# Patient Record
Sex: Female | Born: 1995 | Hispanic: No | Marital: Single | Smoking: Never smoker
Health system: Southern US, Community
[De-identification: ages and names within clinical notes are randomized; demographics above are authoritative.]

## PROBLEM LIST (undated history)

## (undated) DIAGNOSIS — N926 Irregular menstruation, unspecified: Principal | ICD-10-CM

## (undated) HISTORY — DX: Irregular menstruation, unspecified: N92.6

---
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
## 2013-05-22 ENCOUNTER — Encounter: Payer: Self-pay | Admitting: *Deleted

## 2013-05-23 ENCOUNTER — Ambulatory Visit: Payer: Self-pay

## 2013-05-25 ENCOUNTER — Ambulatory Visit (INDEPENDENT_AMBULATORY_CARE_PROVIDER_SITE_OTHER): Payer: Medicaid Other | Admitting: Obstetrics & Gynecology

## 2013-05-25 ENCOUNTER — Encounter: Payer: Self-pay | Admitting: Obstetrics & Gynecology

## 2013-05-25 VITALS — BP 100/60 | Ht 62.5 in | Wt 146.0 lb

## 2013-05-25 DIAGNOSIS — Z3202 Encounter for pregnancy test, result negative: Secondary | ICD-10-CM

## 2013-05-25 DIAGNOSIS — Z3049 Encounter for surveillance of other contraceptives: Secondary | ICD-10-CM

## 2013-05-25 DIAGNOSIS — Z3200 Encounter for pregnancy test, result unknown: Secondary | ICD-10-CM

## 2013-05-25 DIAGNOSIS — Z309 Encounter for contraceptive management, unspecified: Secondary | ICD-10-CM

## 2013-05-25 LAB — POCT URINE PREGNANCY: Preg Test, Ur: NEGATIVE

## 2013-05-25 MED ORDER — MEDROXYPROGESTERONE ACETATE 150 MG/ML IM SUSP
150.0000 mg | Freq: Once | INTRAMUSCULAR | Status: AC
  Administered 2013-05-25: 150 mg via INTRAMUSCULAR

## 2013-08-17 ENCOUNTER — Ambulatory Visit: Payer: Medicaid Other

## 2013-11-11 ENCOUNTER — Emergency Department (HOSPITAL_COMMUNITY)
Admission: EM | Admit: 2013-11-11 | Discharge: 2013-11-11 | Disposition: A | Discharge status: Home / Self Care | Payer: Self-pay | Attending: Emergency Medicine | Admitting: Emergency Medicine

## 2013-11-11 ENCOUNTER — Encounter (HOSPITAL_COMMUNITY): Payer: Self-pay | Admitting: Emergency Medicine

## 2013-11-11 DIAGNOSIS — R220 Localized swelling, mass and lump, head: Secondary | ICD-10-CM

## 2013-11-11 DIAGNOSIS — L239 Allergic contact dermatitis, unspecified cause: Secondary | ICD-10-CM

## 2013-11-11 DIAGNOSIS — L253 Unspecified contact dermatitis due to other chemical products: Secondary | ICD-10-CM | POA: Insufficient documentation

## 2013-11-11 DIAGNOSIS — Z79899 Other long term (current) drug therapy: Secondary | ICD-10-CM | POA: Insufficient documentation

## 2013-11-11 MED ORDER — PREDNISONE (PAK) 10 MG PO TABS: ORAL_TABLET | Freq: Every day | ORAL | Status: DC

## 2013-11-11 MED ORDER — LORATADINE 10 MG PO TABS
10.0000 mg | ORAL_TABLET | Freq: Once | ORAL | Status: AC
  Administered 2013-11-11: 10 mg via ORAL
  Filled 2013-11-11: qty 1

## 2013-11-11 MED ORDER — FAMOTIDINE 20 MG PO TABS
20.0000 mg | ORAL_TABLET | Freq: Once | ORAL | Status: AC
  Administered 2013-11-11: 20 mg via ORAL
  Filled 2013-11-11: qty 1

## 2013-11-11 MED ORDER — LORATADINE 10 MG PO TABS: 10.0000 mg | ORAL_TABLET | Freq: Every day | ORAL | Status: DC

## 2013-11-11 MED ORDER — PREDNISONE 50 MG PO TABS
60.0000 mg | ORAL_TABLET | Freq: Once | ORAL | Status: AC
  Administered 2013-11-11: 60 mg via ORAL
  Filled 2013-11-11 (×2): qty 1

## 2013-11-11 MED ORDER — FAMOTIDINE 20 MG PO TABS: 20.0000 mg | ORAL_TABLET | Freq: Two times a day (BID) | ORAL | Status: DC

## 2013-11-11 NOTE — ED Notes (Signed)
Pt states she used a new lipstick last night. Sates she woke up this morning with her face ans mouth swollen.

## 2013-11-11 NOTE — ED Provider Notes (Signed)
CSN: [id number]     Arrival date & time 11/11/13  3086 History   First MD Initiated Contact with Patient 11/11/13 0930     Chief Complaint  Patient presents with  . Allergic Reaction   (Consider location/radiation/quality/duration/timing/severity/associated sxs/prior Treatment) Patient is a 17 y.o. female presenting with allergic reaction. The history is provided by the patient and a parent.  Allergic Reaction Presenting symptoms: swelling   Presenting symptoms: no difficulty breathing, no difficulty swallowing, no itching, no rash and no wheezing   Severity:  Moderate Prior episodes: lip stick. Context: cosmetics   Relieved by:  Nothing Worsened by:  Nothing tried Ineffective treatments:  Antihistamines  Frances Pierce is a 17 y.o. female who presents to the ED with swelling to her lips and mild facial swelling. She used a lip stick last night about 7:30 and soon after noted the swelling. Her mother has been giving her Benadryl. She slept last night but this morning continues to have swelling despite the Benadryl. She had a similar episode last week when she used another lip product but the swelling went away with Benadryl. She denies shortness of breath, difficulty swallowing or other problems.   History reviewed. No pertinent past medical history. History reviewed. No pertinent past surgical history. No family history on file. History  Substance Use Topics  . Smoking status: Never Smoker   . Smokeless tobacco: Never Used  . Alcohol Use: No   OB History   Grav Para Term Preterm Abortions TAB SAB Ect Mult Living                 Review of Systems  Constitutional: Negative for fever, chills and activity change.  HENT: Negative for congestion, drooling, sore throat and trouble swallowing. Facial swelling: minimal.        Swollen lips   Eyes: Negative for redness and itching.  Respiratory: Negative for chest tightness, shortness of breath and wheezing.   Cardiovascular: Negative  for chest pain.  Gastrointestinal: Negative for nausea, vomiting and abdominal pain.  Musculoskeletal: Negative for joint swelling.  Skin: Negative for itching and rash.  Allergic/Immunologic: Negative for immunocompromised state.  Neurological: Negative for light-headedness and headaches.  Psychiatric/Behavioral: The patient is not nervous/anxious.     Allergies  Review of patient's allergies indicates no known allergies.  Home Medications   Current Outpatient Rx  Name  Route  Sig  Dispense  Refill  . Levonorgestrel-Ethinyl Estradiol (SEASONIQUE) 0.15-0.03 &0.01 MG tablet   Oral   Take 1 tablet by mouth daily.         . medroxyPROGESTERone (DEPO-PROVERA) 150 MG/ML injection   Intramuscular   Inject 150 mg into the muscle every 3 (three) months.          BP 122/74  Pulse 76  Temp(Src) 98.8 F (37.1 C) (Oral)  Resp 20  Ht 5\' 4"  (1.626 m)  Wt 140 lb (63.504 kg)  BMI 24.02 kg/m2  SpO2 100% Physical Exam  Nursing note and vitals reviewed. Constitutional: She is oriented to person, place, and time. She appears well-developed and well-nourished. No distress.  HENT:  Head:    Nose: Nose normal.  Mouth/Throat: Uvula is midline, oropharynx is clear and moist and mucous membranes are normal.  Swelling to upper and lower lip, minimal facial swelling  Eyes: EOM are normal.  Neck: Neck supple.  Cardiovascular: Normal rate and regular rhythm.   Pulmonary/Chest: Effort normal and breath sounds normal. She has no wheezes.  Abdominal: Soft. There is no tenderness.  Musculoskeletal: Normal range of motion.  Neurological: She is alert and oriented to person, place, and time. No cranial nerve deficit.  Skin: Skin is warm and dry.  Psychiatric: She has a normal mood and affect. Her behavior is normal.    ED Course  Procedures Prednisone 60 mg. PO, Claritin 10 mg. PO, Pepcid and ice pack to lips. She has already had Benadryl prior to arrival to the ED. Will observe for  improvement.  MDM  After medications and ice the patient has less swelling to the face and lips. She states she can tell a difference and is feeling better. She is stable for discharge without any immediate complications.  17 y.o. female with allergic reaction to lip stick she used. Will continue treatment of medications started in ED.  Discussed with the patient and all questioned fully answered. She will return if any problems arise.    Medication List    TAKE these medications       famotidine 20 MG tablet  Commonly known as:  PEPCID  Take 1 tablet (20 mg total) by mouth 2 (two) times daily.     loratadine 10 MG tablet  Commonly known as:  CLARITIN  Take 1 tablet (10 mg total) by mouth daily.     predniSONE 10 MG tablet  Commonly known as:  STERAPRED UNI-PAK  Take by mouth daily. Starting tomorrow 11/12/2013 take 5 tablets by mouth, then 4, 3, 2, 1      ASK your doctor about these medications       diphenhydrAMINE 25 mg capsule  Commonly known as:  BENADRYL  Take 25 mg by mouth every 6 (six) hours as needed for allergies.     medroxyPROGESTERone 150 MG/ML injection  Commonly known as:  DEPO-PROVERA  Inject 150 mg into the muscle every 3 (three) months.           Janne Napoleon, NP 11/11/13 1505

## 2013-11-12 NOTE — ED Provider Notes (Signed)
Medical screening examination/treatment/procedure(s) were performed by non-physician practitioner and as supervising physician I was immediately available for consultation/collaboration.  EKG Interpretation   None         Benny Lennert, MD 11/12/13 [phone]

## 2014-05-17 ENCOUNTER — Encounter: Payer: Self-pay | Admitting: Adult Health

## 2014-05-17 ENCOUNTER — Ambulatory Visit (INDEPENDENT_AMBULATORY_CARE_PROVIDER_SITE_OTHER): Payer: BC Managed Care – PPO | Admitting: Adult Health

## 2014-05-17 VITALS — BP 122/70 | Ht 62.5 in | Wt 151.5 lb

## 2014-05-17 DIAGNOSIS — N926 Irregular menstruation, unspecified: Secondary | ICD-10-CM

## 2014-05-17 DIAGNOSIS — Z3202 Encounter for pregnancy test, result negative: Secondary | ICD-10-CM

## 2014-05-17 HISTORY — DX: Irregular menstruation, unspecified: N92.6

## 2014-05-17 LAB — POCT URINE PREGNANCY: PREG TEST UR: NEGATIVE

## 2014-05-17 NOTE — Patient Instructions (Signed)
Use condoms Call if no menses for 3 months

## 2014-05-17 NOTE — Progress Notes (Signed)
Subjective:     Patient ID: Frances Pierce, female   DOB: 10/09/1996, 18 y.o.   MRN: [id number]  HPI Frances Pierce is a 18 year old black female who last had a period first of April.  Review of Systems See HPI Reviewed past medical,surgical, social and family history. Reviewed medications and allergies.     Objective:   Physical Exam BP 122/70  Ht 5' 2.5" (1.588 m)  Wt 151 lb 8 oz (68.72 kg)  BMI 27.25 kg/m2  LMP 03/26/2014   UPT negative, discussed could miss a period occasionally but if having sex needs to use condoms or birth control to prevent pregnancy, and she will use condoms for now  Assessment:     Missed period     Plan:     Use condoms Call if no menses for 3 months

## 2015-10-28 ENCOUNTER — Encounter (HOSPITAL_COMMUNITY): Payer: Self-pay | Admitting: Emergency Medicine

## 2015-10-28 ENCOUNTER — Emergency Department (HOSPITAL_COMMUNITY): Payer: Self-pay

## 2015-10-28 ENCOUNTER — Emergency Department (HOSPITAL_COMMUNITY)
Admission: EM | Admit: 2015-10-28 | Discharge: 2015-10-28 | Disposition: A | Discharge status: Home / Self Care | Payer: Self-pay | Attending: Emergency Medicine | Admitting: Emergency Medicine

## 2015-10-28 DIAGNOSIS — T189XXA Foreign body of alimentary tract, part unspecified, initial encounter: Secondary | ICD-10-CM | POA: Insufficient documentation

## 2015-10-28 DIAGNOSIS — Y9389 Activity, other specified: Secondary | ICD-10-CM | POA: Insufficient documentation

## 2015-10-28 DIAGNOSIS — J029 Acute pharyngitis, unspecified: Secondary | ICD-10-CM | POA: Insufficient documentation

## 2015-10-28 DIAGNOSIS — Z87821 Personal history of retained foreign body fully removed: Secondary | ICD-10-CM

## 2015-10-28 DIAGNOSIS — Z3202 Encounter for pregnancy test, result negative: Secondary | ICD-10-CM | POA: Insufficient documentation

## 2015-10-28 DIAGNOSIS — X58XXXA Exposure to other specified factors, initial encounter: Secondary | ICD-10-CM | POA: Insufficient documentation

## 2015-10-28 DIAGNOSIS — Y998 Other external cause status: Secondary | ICD-10-CM | POA: Insufficient documentation

## 2015-10-28 DIAGNOSIS — Y9289 Other specified places as the place of occurrence of the external cause: Secondary | ICD-10-CM | POA: Insufficient documentation

## 2015-10-28 DIAGNOSIS — Z8742 Personal history of other diseases of the female genital tract: Secondary | ICD-10-CM | POA: Insufficient documentation

## 2015-10-28 LAB — POC URINE PREG, ED: PREG TEST UR: NEGATIVE

## 2015-10-28 NOTE — ED Notes (Signed)
Was drinking a sun drop from a can.  Pt was not eating any food but feels like something is struck in her chest. C/o pain when breathing in and out, rates pain 4/10.

## 2015-10-28 NOTE — ED Provider Notes (Signed)
Medical screening examination/treatment/procedure(s) were conducted as a shared visit with non-physician practitioner(s) and myself.  I personally evaluated the patient during the encounter.   EKG Interpretation None      Results for orders placed or performed during the hospital encounter of 10/28/15  POC Urine Pregnancy, ED ([facility])  Result Value Ref Range   Preg Test, Ur NEGATIVE NEGATIVE   Dg Abd Acute W/chest  10/28/2015  CLINICAL DATA:  Chest pain shortness of breath after drinking soda EXAM: DG ABDOMEN ACUTE W/ 1V CHEST COMPARISON:  None. FINDINGS: Mild sigmoid scoliotic curvature thoracolumbar spine. Heart size and vascular pattern are normal. Lungs are clear. No free air. No abnormally dilated loops of bowel. No abnormal opacities over the abdomen. IMPRESSION: Scoliosis with no acute findings Electronically Signed   By: Esperanza Heiraymond  Rubner M.D.   On: 10/28/2015 19:27    Patient seen by me. Patient was drinking a soda when she felt like something went down her food pipe and gets stuck in it but the middle part of her chest. Patient complaint when breathing in and out rates the discomfort is for us 10.  X-ray here is negative for any foreign body. Patient without any respiratory distress. Oxygen saturation is 100%. Patient able to drink fluids and eat crackers without any difficulty. No evidence clinical evidence of any persistent esophageal foreign body. Maybe form body sensation. Patient stable for discharge home.  Patient's lungs are clear bilaterally. Abdomen soft and flat.  Vanetta MuldersScott Jil Penland, MD 10/28/15 2037

## 2015-10-28 NOTE — ED Provider Notes (Signed)
CSN: [id number]     Arrival date & time 10/28/15  1643 History  By signing my name below, I, Budd Palmer, attest that this documentation has been prepared under the direction and in the presence of [employer], PA-C. Electronically Signed: Budd Palmer, ED Scribe. 10/28/2015. 6:36 PM.    Chief Complaint  Patient presents with  . Swallowed Foreign Body   The history is provided by the patient and a parent. No language interpreter was used.   HPI Comments: Frances Pierce is a 19 y.o. female who presents to the Emergency Department complaining of swallowing a foreign body that occurred just PTA. Pt states she was drinking out of a soda can in her room when she felt as though she swallowed something. She notes she felt something hard going down her throat. She notes she tried to spit it out, but states that at the time it had already gone down her throat. She states she made herself vomit but did not see any foreign bodies in the vomit. She reports still feeling as though there is something stuck in her central chest.  Per mom, pt's PCP is at [place]. She denies any other medical problems. Pt denies SOB, cough, choking sensation or abdominal pain.   Past Medical History  Diagnosis Date  . Missed period 05/17/2014   History reviewed. No pertinent past surgical history. Family History  Problem Relation Age of Onset  . Hypertension Maternal Grandmother    Social History  Substance Use Topics  . Smoking status: Never Smoker   . Smokeless tobacco: Never Used  . Alcohol Use: No   OB History    Gravida Para Term Preterm AB TAB SAB Ectopic Multiple Living   0              Review of Systems  Constitutional: Negative for fever.  HENT: Positive for sore throat. Negative for congestion.   Eyes: Negative.   Respiratory: Negative for chest tightness and shortness of breath.   Cardiovascular: Negative for chest pain.  Gastrointestinal: Positive for vomiting (self-induced). Negative for nausea  and abdominal pain.  Genitourinary: Negative.   Musculoskeletal: Negative for back pain, joint swelling, arthralgias and neck pain.  Skin: Negative.  Negative for rash and wound.  Neurological: Negative for dizziness, weakness, light-headedness, numbness and headaches.  Psychiatric/Behavioral: Negative.     Allergies  Review of patient's allergies indicates no known allergies.  Home Medications   Prior to Admission medications   Not on File   BP 109/67 mmHg  Pulse 68  Temp(Src) 98.6 F (37 C) (Oral)  Resp 20  Ht  (1.549 m)  Wt 129 lb (58.514 kg)  BMI 24.39 kg/m2  SpO2 100%  LMP 10/08/2015 Physical Exam  Constitutional: She appears well-developed and well-nourished.  Pt is able to swallow her saliva  HENT:  Head: Normocephalic and atraumatic.  Eyes: Conjunctivae are normal.  Neck: Normal range of motion.  Cardiovascular: Normal rate, regular rhythm, normal heart sounds and intact distal pulses.   Pulmonary/Chest: Effort normal and breath sounds normal. No stridor. She has no wheezes.  Abdominal: Soft. Bowel sounds are normal. There is no tenderness.  Musculoskeletal: Normal range of motion.  Neurological: She is alert.  Skin: Skin is warm and dry.  Psychiatric: She has a normal mood and affect.  Nursing note and vitals reviewed.   ED Course  Procedures  DIAGNOSTIC STUDIES: Oxygen Saturation is 100% on RA, normal by my interpretation.    COORDINATION OF CARE: 6:34 PM -  Discussed plans to order diagnostic studies and imaging. Pt advised of plan for treatment and pt agrees.  Labs Review Labs Reviewed  POC URINE PREG, ED    Imaging Review Dg Abd Acute W/chest  10/28/2015  CLINICAL DATA:  Chest pain shortness of breath after drinking soda EXAM: DG ABDOMEN ACUTE W/ 1V CHEST COMPARISON:  None. FINDINGS: Mild sigmoid scoliotic curvature thoracolumbar spine. Heart size and vascular pattern are normal. Lungs are clear. No free air. No abnormally dilated loops of  bowel. No abnormal opacities over the abdomen. IMPRESSION: Scoliosis with no acute findings Electronically Signed   By: Esperanza Heiraymond  Rubner M.D.   On: 10/28/2015 19:27   I have personally reviewed and evaluated these images and lab results as part of my medical decision-making.   EKG Interpretation None      MDM   Final diagnoses:  H/O swallowed foreign body    Radiological studies were viewed, interpreted and considered during the medical decision making and disposition process. I agree with radiologists reading.  Results were also discussed with patient.  Pt was given gingerale, crackers which she tolerated without emesis.  Discussed watchful waiting, recheck here or by pcp for any new sx, vomiting, abdominal pain or distention.  Advised to watch stool for any passed fb.  Pt and mother understand and agree with plan.   I personally performed the services described in this documentation, which was scribed in my presence. The recorded information has been reviewed and is accurate.   Burgess AmorJulie Vianny Schraeder, PA-C 10/28/15 2104

## 2015-12-03 ENCOUNTER — Emergency Department (HOSPITAL_COMMUNITY)
Admission: EM | Admit: 2015-12-03 | Discharge: 2015-12-03 | Disposition: A | Discharge status: Home / Self Care | Payer: Self-pay | Attending: Emergency Medicine | Admitting: Emergency Medicine

## 2015-12-03 ENCOUNTER — Encounter (HOSPITAL_COMMUNITY): Payer: Self-pay | Admitting: *Deleted

## 2015-12-03 DIAGNOSIS — N760 Acute vaginitis: Secondary | ICD-10-CM | POA: Insufficient documentation

## 2015-12-03 DIAGNOSIS — B9689 Other specified bacterial agents as the cause of diseases classified elsewhere: Secondary | ICD-10-CM

## 2015-12-03 LAB — WET PREP, GENITAL
SPERM: NONE SEEN
TRICH WET PREP: NONE SEEN
Yeast Wet Prep HPF POC: NONE SEEN

## 2015-12-03 MED ORDER — METRONIDAZOLE 500 MG PO TABS: ORAL_TABLET | ORAL | Status: DC

## 2015-12-03 NOTE — Discharge Instructions (Signed)

## 2015-12-03 NOTE — ED Provider Notes (Signed)
CSN: [id number]     Arrival date & time 12/03/15  1940 History   First MD Initiated Contact with Patient 12/03/15 1946     No chief complaint on file.    (Consider location/radiation/quality/duration/timing/severity/associated sxs/prior Treatment) HPI Comments: Pt report possible STD exposure.  Patient is a 19 y.o. female presenting with STD exposure. The history is provided by the patient.  Exposure to STD This is a new problem. The current episode started in the past 7 days. The problem has been unchanged. Pertinent negatives include no abdominal pain, arthralgias, chest pain, chills, coughing, fever, neck pain, rash, sore throat, swollen glands or vomiting. Nothing aggravates the symptoms.    Past Medical History  Diagnosis Date  . Missed period 05/17/2014   History reviewed. No pertinent past surgical history. Family History  Problem Relation Age of Onset  . Hypertension Maternal Grandmother    Social History  Substance Use Topics  . Smoking status: Never Smoker   . Smokeless tobacco: Never Used  . Alcohol Use: Yes     Comment: occasional   OB History    Gravida Para Term Preterm AB TAB SAB Ectopic Multiple Living   0              Review of Systems  Constitutional: Negative for fever, chills and activity change.       All ROS Neg except as noted in HPI  HENT: Negative for nosebleeds and sore throat.   Eyes: Negative for photophobia and discharge.  Respiratory: Negative for cough, shortness of breath and wheezing.   Cardiovascular: Negative for chest pain and palpitations.  Gastrointestinal: Negative for vomiting, abdominal pain and blood in stool.  Genitourinary: Negative for dysuria, frequency and hematuria.  Musculoskeletal: Negative for back pain, arthralgias and neck pain.  Skin: Negative.  Negative for rash.  Neurological: Negative for dizziness, seizures and speech difficulty.  Psychiatric/Behavioral: Negative for hallucinations and confusion.       Allergies  Review of patient's allergies indicates no known allergies.  Home Medications   Prior to Admission medications   Not on File   BP 138/71 mmHg  Pulse 77  Temp(Src) 98.4 F (36.9 C) (Oral)  Resp 15  Ht 5\' 2"  (1.575 m)  Wt 58.514 kg  BMI 23.59 kg/m2  SpO2 100%  LMP 10/29/2015 Physical Exam  Constitutional: She is oriented to person, place, and time. She appears well-developed and well-nourished.  Non-toxic appearance.  HENT:  Head: Normocephalic.  Right Ear: Tympanic membrane and external ear normal.  Left Ear: Tympanic membrane and external ear normal.  Eyes: EOM and lids are normal. Pupils are equal, round, and reactive to light.  Neck: Normal range of motion. Neck supple. Carotid bruit is not present.  Cardiovascular: Normal rate, regular rhythm, normal heart sounds, intact distal pulses and normal pulses.   Pulmonary/Chest: Breath sounds normal. No respiratory distress.  Abdominal: Soft. Bowel sounds are normal. There is no tenderness. There is no guarding.  Genitourinary:  Chaperone present during the examination.  The external structures show no rash, trauma, or other problems. There no foreign bodies noted in the vaginal vault. There is moderate discharge noted in the vaginal vault. The os of the cervix is closed. There is no wall motion tenderness, no adnexal tenderness.  Musculoskeletal: Normal range of motion.  Lymphadenopathy:       Head (right side): No submandibular adenopathy present.       Head (left side): No submandibular adenopathy present.    She has no cervical  adenopathy.  Neurological: She is alert and oriented to person, place, and time. She has normal strength. No cranial nerve deficit or sensory deficit.  Skin: Skin is warm and dry.  Psychiatric: She has a normal mood and affect. Her speech is normal.  Nursing note and vitals reviewed.   ED Course  Procedures (including critical care time) Labs Review Labs Reviewed - No data to  display  Imaging Review No results found. I have personally reviewed and evaluated these images and lab results as part of my medical decision-making.   EKG Interpretation None      MDM  Vital signs are well within normal limits. The wet prep shows clue cells present with moderate white blood cells. Gonorrhea, Chlamydia, RPR, and HIV are pending. The patient is to [facility]-up [facility] the [facility] if any additional changes or problems.    Final diagnoses:  None    **I have reviewed nursing notes, vital signs, and all appropriate lab and imaging results for this patient.Ivery Quale, PA-C 12/03/15 2159  Eber Hong, MD 12/04/15 [phone]

## 2015-12-03 NOTE — ED Notes (Signed)
Pt reports unprotected sex a week ago. Wants to be checked for STD. Denies having any discharge, itching or rashes. No fevers.

## 2015-12-03 NOTE — ED Notes (Signed)
Pt requesting STD check.  Pt reports having unprotected sex with person who was possibly infected.

## 2015-12-03 NOTE — ED Notes (Signed)
Pt alert & oriented x4, stable gait. Patient  given discharge instructions, paperwork & prescription(s). Patient verbalized understanding. Pt left department w/ no further questions. 

## 2015-12-05 LAB — GC/CHLAMYDIA PROBE AMP (~~LOC~~) NOT AT ARMC
Chlamydia: NEGATIVE
Neisseria Gonorrhea: NEGATIVE

## 2015-12-05 LAB — RPR: RPR Ser Ql: NONREACTIVE

## 2015-12-05 LAB — HIV ANTIBODY (ROUTINE TESTING W REFLEX): HIV Screen 4th Generation wRfx: NONREACTIVE

## 2016-08-01 ENCOUNTER — Encounter (HOSPITAL_COMMUNITY): Payer: Self-pay | Admitting: Emergency Medicine

## 2016-08-01 ENCOUNTER — Emergency Department (HOSPITAL_COMMUNITY)
Admission: EM | Admit: 2016-08-01 | Discharge: 2016-08-01 | Disposition: A | Discharge status: Home / Self Care | Payer: BLUE CROSS/BLUE SHIELD | Attending: Emergency Medicine | Admitting: Emergency Medicine

## 2016-08-01 DIAGNOSIS — Y990 Civilian activity done for income or pay: Secondary | ICD-10-CM | POA: Insufficient documentation

## 2016-08-01 DIAGNOSIS — S39012A Strain of muscle, fascia and tendon of lower back, initial encounter: Secondary | ICD-10-CM | POA: Diagnosis not present

## 2016-08-01 DIAGNOSIS — M545 Low back pain: Secondary | ICD-10-CM | POA: Diagnosis present

## 2016-08-01 DIAGNOSIS — X500XXA Overexertion from strenuous movement or load, initial encounter: Secondary | ICD-10-CM | POA: Insufficient documentation

## 2016-08-01 DIAGNOSIS — Y93F2 Activity, caregiving, lifting: Secondary | ICD-10-CM | POA: Insufficient documentation

## 2016-08-01 DIAGNOSIS — Y929 Unspecified place or not applicable: Secondary | ICD-10-CM | POA: Diagnosis not present

## 2016-08-01 MED ORDER — METHOCARBAMOL 500 MG PO TABS: ORAL_TABLET | ORAL | 0 refills | Status: DC

## 2016-08-01 NOTE — ED Provider Notes (Signed)
[facility]EMERG[facility]Y DEPT Provider Note   CSN: [id number] Arrival date & time: 08/01/16  4098  First Provider Contact:  First MD Initiated Contact with Patient 08/01/16 2032        History   Chief Complaint Chief Complaint  Patient presents with  . Back Pain    HPI Frances Pierce is a 20 y.o. female.  Patient is a 20 year old female who presents to the emergency department with a complaint of lower back pain.  The patient states that on last evening she was lifting a heavy object at her work, which he felt something pull in her back. She had immediate pain. She finished her shift, and took ibuprofen what she was at home. She states that this helped some, but on this morning the pain continued. She began trying stretching exercises, but these did not seem to help on. She presents to the emergency department at this time for evaluation concerning her back. There's been no loss of bowel or bladder function. There's been no paresthesias area of the lower extremities on. There's been no complaint of numbness or changes in the saddle area. Certain movements and positions makes the pain worse. Ibuprofen and stretching seem to help but does not resolve the pain.   The history is provided by the patient.    Past Medical History:  Diagnosis Date  . Missed period 05/17/2014    Patient Active Problem List   Diagnosis Date Noted  . Missed period 05/17/2014    History reviewed. No pertinent surgical history.  OB History    Gravida Para Term Preterm AB Living   0             SAB TAB Ectopic Multiple Live Births                   Home Medications    Prior to Admission medications   Medication Sig Start Date End Date Taking? Authorizing Provider  metroNIDAZOLE (FLAGYL) 500 MG tablet Please take with food 12/03/15   Ivery Quale, PA-C  metroNIDAZOLE (FLAGYL) 500 MG tablet 1 po bid with a meal 12/03/15   Ivery Quale, PA-C    Family History Family History  Problem Relation Age of  Onset  . Hypertension Maternal Grandmother     Social History Social History  Substance Use Topics  . Smoking status: Never Smoker  . Smokeless tobacco: Never Used  . Alcohol use Yes     Comment: occasional     Allergies   Review of patient's allergies indicates no known allergies.   Review of Systems Review of Systems  Constitutional: Negative for activity change.       All ROS Neg except as noted in HPI  HENT: Negative for nosebleeds.   Eyes: Negative for photophobia and discharge.  Respiratory: Negative for cough, shortness of breath and wheezing.   Cardiovascular: Negative for chest pain and palpitations.  Gastrointestinal: Negative for abdominal pain and blood in stool.  Genitourinary: Negative for dysuria, frequency and hematuria.  Musculoskeletal: Positive for back pain. Negative for arthralgias and neck pain.  Skin: Negative.   Neurological: Negative for dizziness, seizures and speech difficulty.  Psychiatric/Behavioral: Negative for confusion and hallucinations.     Physical Exam Updated Vital Signs BP 132/79 (BP Location: Left Arm)   Pulse 70   Temp 98.4 F (36.9 C) (Oral)   Resp 14   Ht  (1.575 m)   Wt 63.5 kg   LMP 07/21/2016   SpO2 99%   BMI 25.61  kg/m   Physical Exam  Constitutional: She is oriented to person, place, and time. She appears well-developed and well-nourished.  Non-toxic appearance.  HENT:  Head: Normocephalic.  Right Ear: Tympanic membrane and external ear normal.  Left Ear: Tympanic membrane and external ear normal.  Eyes: EOM and lids are normal. Pupils are equal, round, and reactive to light.  Neck: Normal range of motion. Neck supple. Carotid bruit is not present.  Cardiovascular: Normal rate, regular rhythm, normal heart sounds, intact distal pulses and normal pulses.   Pulmonary/Chest: Breath sounds normal. No respiratory distress.  Abdominal: Soft. Bowel sounds are normal. There is no tenderness. There is no guarding.    Musculoskeletal: Normal range of motion.       Lumbar back: She exhibits spasm.       Back:  Lymphadenopathy:       Head (right side): No submandibular adenopathy present.       Head (left side): No submandibular adenopathy present.    She has no cervical adenopathy.  Neurological: She is alert and oriented to person, place, and time. She has normal strength. No cranial nerve deficit or sensory deficit.  Skin: Skin is warm and dry.  Psychiatric: She has a normal mood and affect. Her speech is normal.  Nursing note and vitals reviewed.    ED Treatments / Results  Labs (all labs ordered are listed, but only abnormal results are displayed) Labs Reviewed - No data to display  EKG  EKG Interpretation None       Radiology No results found.  Procedures Procedures (including critical care time)  Medications Ordered in ED Medications - No data to display   Initial Impression / Assessment and Plan / ED Course  I have reviewed the triage vital signs and the nursing notes.  Pertinent labs & imaging results that were available during my care of the patient were reviewed by me and considered in my medical decision making (see chart for details).  Clinical Course    *I have reviewed nursing notes, vital signs, and all appropriate lab and imaging results for this patient.**  Final Clinical Impressions(s) / ED Diagnoses  Patient is a 20 year old female who presents to the emergency department with back pain following lifting heavy object. The examination favors lumbar strain. No gross neurologic deficits appreciated. No evidence for caudal equina or other emergent situations on. I've advised the patient to use warm tub soaks. She will initiate stretching exercises. She will continue her ibuprofen. She will use Robaxin at bedtime, or every 6 hours as needed for spasm pain. The patient is to return to the emergency department or follow-up with her primary physician if not improving.     Final diagnoses:  Lumbar strain, initial encounter    New Prescriptions New Prescriptions   No medications on file     Ivery QualeHobson Bayla Mcgovern, Cordelia Poche-C 08/01/16 2047    Eber HongBrian Miller, MD 08/01/16 2333

## 2016-08-01 NOTE — ED Triage Notes (Signed)
Pt states she was at work last night when she picked up a trash can and felt pain in her R Lower back.

## 2016-08-01 NOTE — Discharge Instructions (Signed)
Warm tub soaks to your back maybe helpful. Use ibuprofen every 6 hours for mild pain. May use Robaxin at bedtime if needed for spasm pain. Robaxin may cause drowsiness, please do not drive, operate machinery, or drink alcohol when taking this medication.

## 2016-08-01 NOTE — ED Notes (Signed)
ambulates heel to toe without stagger or drift

## 2016-10-30 ENCOUNTER — Encounter (HOSPITAL_COMMUNITY): Payer: Self-pay | Admitting: Emergency Medicine

## 2016-10-30 ENCOUNTER — Emergency Department (HOSPITAL_COMMUNITY)
Admission: EM | Admit: 2016-10-30 | Discharge: 2016-10-30 | Disposition: A | Discharge status: Home / Self Care | Payer: BLUE CROSS/BLUE SHIELD | Attending: Emergency Medicine | Admitting: Emergency Medicine

## 2016-10-30 DIAGNOSIS — Z113 Encounter for screening for infections with a predominantly sexual mode of transmission: Secondary | ICD-10-CM | POA: Diagnosis present

## 2016-10-30 LAB — URINALYSIS, ROUTINE W REFLEX MICROSCOPIC
Bilirubin Urine: NEGATIVE
GLUCOSE, UA: NEGATIVE mg/dL
HGB URINE DIPSTICK: NEGATIVE
Ketones, ur: NEGATIVE mg/dL
Leukocytes, UA: NEGATIVE
Nitrite: NEGATIVE
Protein, ur: NEGATIVE mg/dL
SPECIFIC GRAVITY, URINE: 1.015 (ref 1.005–1.030)
pH: 5.5 (ref 5.0–8.0)

## 2016-10-30 LAB — WET PREP, GENITAL
SPERM: NONE SEEN
Trich, Wet Prep: NONE SEEN
Yeast Wet Prep HPF POC: NONE SEEN

## 2016-10-30 NOTE — ED Triage Notes (Signed)
Pt requesting STD check. Pt denies symptoms.

## 2016-10-30 NOTE — ED Provider Notes (Signed)
[facility] Provider Note   CSN: [id number] Arrival date & time: 10/30/16  1029     History   Chief Complaint Chief Complaint  Patient presents with  . SEXUALLY TRANSMITTED DISEASE    HPI Frances Pierce is a 20 y.o. female.  20 year old African-American female with no significant past medical history presents to the ED today requesting STD check as advised by her boyfriend. Patient denies any symptoms including urinary symptoms, vaginal discharge, vaginal bleeding. States that she was tested approximately 10 days ago by her primary care doctor with negative results. However, patient boyfriend wanted to get tested again in the ED today. Denies any fever, chills, abdominal pain. She states she is sexually active with one partner and they do not use protection.    The history is provided by the patient.    Past Medical History:  Diagnosis Date  . Missed period 05/17/2014    Patient Active Problem List   Diagnosis Date Noted  . Missed period 05/17/2014    History reviewed. No pertinent surgical history.  OB History    Gravida Para Term Preterm AB Living   0             SAB TAB Ectopic Multiple Live Births                   Home Medications    Prior to Admission medications   Medication Sig Start Date End Date Taking? Authorizing Provider  methocarbamol (ROBAXIN) 500 MG tablet One at bedtime, or every 6 hours as needed for spasm pain. 08/01/16   Ivery QualeHobson Bryant, PA-C  metroNIDAZOLE (FLAGYL) 500 MG tablet Please take with food 12/03/15   Ivery QualeHobson Bryant, PA-C  metroNIDAZOLE (FLAGYL) 500 MG tablet 1 po bid with a meal 12/03/15   Ivery QualeHobson Bryant, PA-C    Family History Family History  Problem Relation Age of Onset  . Hypertension Maternal Grandmother     Social History Social History  Substance Use Topics  . Smoking status: Never Smoker  . Smokeless tobacco: Never Used  . Alcohol use Yes     Comment: occasional     Allergies   Patient has no known  allergies.   Review of Systems Review of Systems  Constitutional: Negative for chills and fever.  Gastrointestinal: Negative for abdominal pain.  Genitourinary: Negative for dysuria, flank pain, frequency, hematuria, pelvic pain, urgency, vaginal bleeding, vaginal discharge and vaginal pain.  Skin: Negative.   All other systems reviewed and are negative.    Physical Exam Updated Vital Signs BP 115/67 (BP Location: Left Arm)   Pulse 66   Temp 98.1 F (36.7 C) (Oral)   Resp 18   Ht 5\' 1"  (1.549 m)   Wt 61.2 kg   LMP 09/30/2016   SpO2 99%   BMI 25.51 kg/m   Physical Exam  Constitutional: She appears well-developed and well-nourished. No distress.  Eyes: Right eye exhibits no discharge. Left eye exhibits no discharge. No scleral icterus.  Pulmonary/Chest: No respiratory distress.  Genitourinary: Pelvic exam was performed with patient prone. There is no rash or tenderness on the right labia. There is no rash or tenderness on the left labia. Cervix exhibits no motion tenderness and no discharge. Right adnexum displays no mass and no tenderness. Left adnexum displays no mass and no tenderness. No erythema, tenderness or bleeding in the vagina. No vaginal discharge found.  Genitourinary Comments: Chaperone present for exam.  Musculoskeletal: Normal range of motion.  Neurological: She is alert.  Skin:  No pallor.  Nursing note and vitals reviewed.    ED Treatments / Results  Labs (all labs ordered are listed, but only abnormal results are displayed) Labs Reviewed  WET PREP, GENITAL - Abnormal; Notable for the following:       Result Value   Clue Cells Wet Prep HPF POC PRESENT (*)    WBC, Wet Prep HPF POC FEW (*)    All other components within normal limits  RPR  HIV ANTIBODY (ROUTINE TESTING)  URINALYSIS, ROUTINE W REFLEX MICROSCOPIC (NOT AT [facility])  GC/CHLAMYDIA PROBE AMP ([place]) NOT AT [facility]    EKG  EKG Interpretation None       Radiology No results  found.  Procedures Procedures (including critical care time)  Medications Ordered in ED Medications - No data to display   Initial Impression / Assessment and Plan / ED Course  I have reviewed the triage vital signs and the nursing notes.  Pertinent labs & imaging results that were available during my care of the patient were reviewed by me and considered in my medical decision making (see chart for details).  Clinical Course   Pt arrives for asymptomatic STD check. STD panel ordered. Patient is a symptomatically this time. Will not treat. We'll wait for cultures. Discussed safe sexual practices. Pt is advised to follow up for free testing at local health department in the future. Pt appears safe for discharge.   Final Clinical Impressions(s) / ED Diagnoses   Final diagnoses:  Screen for STD (sexually transmitted disease)    New Prescriptions New Prescriptions   No medications on file     Rise MuKenneth T Leaphart, PA-C 11/02/16 1035    Canary Brimhristopher J Tegeler, MD 11/02/16 2121
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
## 2016-10-31 LAB — HIV ANTIBODY (ROUTINE TESTING W REFLEX): HIV Screen 4th Generation wRfx: NONREACTIVE

## 2016-10-31 LAB — RPR: RPR: NONREACTIVE

## 2016-11-02 LAB — GC/CHLAMYDIA PROBE AMP (~~LOC~~) NOT AT ARMC
Chlamydia: NEGATIVE
Neisseria Gonorrhea: NEGATIVE

## 2019-02-28 ENCOUNTER — Emergency Department (HOSPITAL_COMMUNITY): Payer: No Typology Code available for payment source

## 2019-02-28 ENCOUNTER — Emergency Department (HOSPITAL_COMMUNITY)
Admission: EM | Admit: 2019-02-28 | Discharge: 2019-02-28 | Disposition: A | Discharge status: Home / Self Care | Payer: No Typology Code available for payment source | Attending: Emergency Medicine | Admitting: Emergency Medicine

## 2019-02-28 ENCOUNTER — Other Ambulatory Visit: Payer: Self-pay

## 2019-02-28 ENCOUNTER — Encounter (HOSPITAL_COMMUNITY): Payer: Self-pay | Admitting: Emergency Medicine

## 2019-02-28 DIAGNOSIS — Y939 Activity, unspecified: Secondary | ICD-10-CM | POA: Diagnosis not present

## 2019-02-28 DIAGNOSIS — Y999 Unspecified external cause status: Secondary | ICD-10-CM | POA: Insufficient documentation

## 2019-02-28 DIAGNOSIS — R10813 Right lower quadrant abdominal tenderness: Secondary | ICD-10-CM | POA: Diagnosis not present

## 2019-02-28 DIAGNOSIS — Z79899 Other long term (current) drug therapy: Secondary | ICD-10-CM | POA: Insufficient documentation

## 2019-02-28 DIAGNOSIS — Y92410 Unspecified street and highway as the place of occurrence of the external cause: Secondary | ICD-10-CM | POA: Diagnosis not present

## 2019-02-28 DIAGNOSIS — S3991XA Unspecified injury of abdomen, initial encounter: Secondary | ICD-10-CM | POA: Diagnosis present

## 2019-02-28 LAB — CBC WITH DIFFERENTIAL/PLATELET
Abs Immature Granulocytes: 0.03 10*3/uL (ref 0.00–0.07)
Basophils Absolute: 0 10*3/uL (ref 0.0–0.1)
Basophils Relative: 0 %
EOS ABS: 0.2 10*3/uL (ref 0.0–0.5)
EOS PCT: 1 %
HCT: 40.8 % (ref 36.0–46.0)
Hemoglobin: 13.2 g/dL (ref 12.0–15.0)
Immature Granulocytes: 0 %
Lymphocytes Relative: 23 %
Lymphs Abs: 2.6 10*3/uL (ref 0.7–4.0)
MCH: 28.9 pg (ref 26.0–34.0)
MCHC: 32.4 g/dL (ref 30.0–36.0)
MCV: 89.3 fL (ref 80.0–100.0)
Monocytes Absolute: 0.6 10*3/uL (ref 0.1–1.0)
Monocytes Relative: 5 %
Neutro Abs: 7.8 10*3/uL — ABNORMAL HIGH (ref 1.7–7.7)
Neutrophils Relative %: 71 %
Platelets: 278 10*3/uL (ref 150–400)
RBC: 4.57 MIL/uL (ref 3.87–5.11)
RDW: 12.9 % (ref 11.5–15.5)
WBC: 11.1 10*3/uL — ABNORMAL HIGH (ref 4.0–10.5)
nRBC: 0 % (ref 0.0–0.2)

## 2019-02-28 LAB — COMPREHENSIVE METABOLIC PANEL
ALBUMIN: 4.6 g/dL (ref 3.5–5.0)
ALK PHOS: 31 U/L — AB (ref 38–126)
ALT: 13 U/L (ref 0–44)
AST: 21 U/L (ref 15–41)
Anion gap: 11 (ref 5–15)
BILIRUBIN TOTAL: 0.6 mg/dL (ref 0.3–1.2)
BUN: 15 mg/dL (ref 6–20)
CALCIUM: 9.2 mg/dL (ref 8.9–10.3)
CO2: 21 mmol/L — ABNORMAL LOW (ref 22–32)
Chloride: 106 mmol/L (ref 98–111)
Creatinine, Ser: 0.71 mg/dL (ref 0.44–1.00)
GFR calc Af Amer: >60 mL/min (ref >60)
GFR calc non Af Amer: >60 mL/min (ref >60)
GLUCOSE: 82 mg/dL (ref 70–99)
Potassium: 3.3 mmol/L — ABNORMAL LOW (ref 3.5–5.1)
Sodium: 138 mmol/L (ref 135–145)
TOTAL PROTEIN: 8.3 g/dL — AB (ref 6.5–8.1)

## 2019-02-28 LAB — PREGNANCY, URINE: Preg Test, Ur: NEGATIVE

## 2019-02-28 MED ORDER — IOHEXOL 300 MG/ML  SOLN
100.0000 mL | Freq: Once | INTRAMUSCULAR | Status: AC | PRN
  Administered 2019-02-28: 100 mL via INTRAVENOUS

## 2019-02-28 MED ORDER — TRAMADOL HCL 50 MG PO TABS: ORAL_TABLET | ORAL | 0 refills | Status: AC
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
## 2019-02-28 NOTE — Discharge Instructions (Addendum)
Follow-up with your doctor if any problems 

## 2019-02-28 NOTE — ED Notes (Signed)
Pt requesting something to drink- informed pt, not until CT chest scan has come back
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
## 2022-11-03 ENCOUNTER — Ambulatory Visit: Payer: Self-pay | Admitting: Adult Health

## 2023-10-28 ENCOUNTER — Encounter: Payer: Self-pay | Admitting: Women's Health

## 2023-10-28 ENCOUNTER — Ambulatory Visit: Payer: Managed Care, Other (non HMO) | Admitting: Women's Health

## 2023-10-28 ENCOUNTER — Other Ambulatory Visit (HOSPITAL_COMMUNITY)
Admission: RE | Admit: 2023-10-28 | Discharge: 2023-10-28 | Disposition: A | Discharge status: Home / Self Care | Payer: Managed Care, Other (non HMO) | Source: Ambulatory Visit | Attending: Women's Health | Admitting: Women's Health

## 2023-10-28 VITALS — BP 111/76 | HR 85 | Ht 62.2 in | Wt 156.0 lb

## 2023-10-28 DIAGNOSIS — R599 Enlarged lymph nodes, unspecified: Secondary | ICD-10-CM | POA: Diagnosis not present

## 2023-10-28 DIAGNOSIS — Z3202 Encounter for pregnancy test, result negative: Secondary | ICD-10-CM | POA: Diagnosis not present

## 2023-10-28 DIAGNOSIS — N852 Hypertrophy of uterus: Secondary | ICD-10-CM

## 2023-10-28 DIAGNOSIS — Z113 Encounter for screening for infections with a predominantly sexual mode of transmission: Secondary | ICD-10-CM | POA: Insufficient documentation

## 2023-10-28 DIAGNOSIS — N946 Dysmenorrhea, unspecified: Secondary | ICD-10-CM

## 2023-10-28 LAB — POCT URINE PREGNANCY: Preg Test, Ur: NEGATIVE

## 2023-10-28 NOTE — Addendum Note (Signed)
Addended by: Federico Flake A on: 10/28/2023 04:18 PM   Modules accepted: Orders

## 2023-10-28 NOTE — Progress Notes (Signed)
GYN VISIT Patient name: Frances Pierce MRN [id number]  Date of birth: [birth year] Chief Complaint:   referral (Recurrent boil)  History of Present Illness:   Frances Pierce is a 27 y.o. G0P0 African-American female being seen today for report of ?cyst Lt labia x ~60yr, never goes away, sometimes gets 'inflamed' and becomes a little tender, never comes to a head like a boil.  Also reports lower abd distension since about May, notices bladder feels displaced higher in the mornings. Has not taken a pregnancy test. Regular periods monthly x 5d, have been more painful lately, takes otc meds which helps. Changes pad or tampon q 4hr, small clots w/ wiping sometimes, none on pad.  Not on birth control, last sexually active couple of months ago.     Patient's last menstrual period was 09/30/2023. The current method of family planning is abstinence.  Last pap 2023. Results were: negative per pt report at [facility]/PCP     10/28/2023    3:36 PM  Depression screen PHQ 2/9  Decreased Interest 0  Down, Depressed, Hopeless 0  PHQ - 2 Score 0  Altered sleeping 0  Tired, decreased energy 1  Change in appetite 0  Feeling bad or failure about yourself  0  Trouble concentrating 0  Moving slowly or fidgety/restless 0  Suicidal thoughts 0  PHQ-9 Score 1        10/28/2023    3:36 PM  GAD 7 : Generalized Anxiety Score  Nervous, Anxious, on Edge 0  Control/stop worrying 0  Worry too much - different things 0  Trouble relaxing 1  Restless 0  Easily annoyed or irritable 0  Afraid - awful might happen 0  Total GAD 7 Score 1     Review of Systems:   Pertinent items are noted in HPI Denies fever/chills, dizziness, headaches, visual disturbances, fatigue, shortness of breath, chest pain, abdominal pain, vomiting, abnormal vaginal discharge/itching/odor/irritation, problems with periods, bowel movements, urination, or intercourse unless otherwise stated above.  Pertinent History Reviewed:  Reviewed past  medical,surgical, social, obstetrical and family history.  Reviewed problem list, medications and allergies. Physical Assessment:   Vitals:   10/28/23 1534  BP: 111/76  Pulse: 85  Weight: 156 lb (70.8 kg)  Height: 5' 2.2" (1.58 m)  Body mass index is 28.35 kg/m.       Physical Examination:   General appearance: alert, well appearing, and in no distress  Mental status: alert, oriented to person, place, and time  Skin: warm & dry   Cardiovascular: normal heart rate noted  Respiratory: normal respiratory effort, no distress  Abdomen: soft, non-tender, uterus @ U and firm   Pelvic: no visible vulvar abnormalities, ~1cm firm nontender probable lymph node Lt labia majora; spec exam: cx & vagina appears normal, normal d/c, CV swab obtained; bimanual- uterus enlarged to about 20wk size and nontender, adnexa wnl  Extremities: no edema   Chaperone: Peggy Dones    No results found for this or any previous visit (from the past 24 hour(s)).  Assessment & Plan:  1) Probable lymph node Lt labia majora  2) Enlarged uterus w/ dysmenorrhea> neg UPT, will schedule pelvic u/s and f/u MD after  3) STD screen  Meds: No orders of the defined types were placed in this encounter.   Orders Placed This Encounter  Procedures   US PELVIS (TRANSABDOMINAL ONLY)   US PELVIS TRANSVAGINAL NON-OB (TV ONLY)    Return for 1st available, US:GYN and f/u MD after, get last pap  records please.  Cheral Marker CNM, [facility] 10/28/2023 4:16 PM

## 2023-11-01 LAB — CERVICOVAGINAL ANCILLARY ONLY
Bacterial Vaginitis (gardnerella): POSITIVE — AB
Candida Glabrata: NEGATIVE
Candida Vaginitis: POSITIVE — AB
Chlamydia: NEGATIVE
Comment: NEGATIVE
Comment: NEGATIVE
Comment: NEGATIVE
Comment: NEGATIVE
Comment: NEGATIVE
Comment: NORMAL
Neisseria Gonorrhea: NEGATIVE
Trichomonas: NEGATIVE

## 2023-11-02 MED ORDER — METRONIDAZOLE 500 MG PO TABS: 500.0000 mg | ORAL_TABLET | Freq: Two times a day (BID) | ORAL | 0 refills | Status: AC

## 2023-11-02 MED ORDER — FLUCONAZOLE 150 MG PO TABS: 150.0000 mg | ORAL_TABLET | Freq: Once | ORAL | 0 refills | Status: AC

## 2023-11-02 NOTE — Addendum Note (Signed)
Addended by: Shawna Clamp R on: 11/02/2023 10:14 AM   Modules accepted: Orders

## 2023-11-15 ENCOUNTER — Ambulatory Visit (INDEPENDENT_AMBULATORY_CARE_PROVIDER_SITE_OTHER): Payer: Managed Care, Other (non HMO)

## 2023-11-15 ENCOUNTER — Ambulatory Visit: Payer: Managed Care, Other (non HMO) | Admitting: Obstetrics & Gynecology

## 2023-11-15 ENCOUNTER — Encounter: Payer: Self-pay | Admitting: Women's Health

## 2023-11-15 ENCOUNTER — Encounter: Payer: Self-pay | Admitting: Obstetrics & Gynecology

## 2023-11-15 VITALS — BP 116/76 | HR 78 | Ht 62.5 in | Wt 156.8 lb

## 2023-11-15 DIAGNOSIS — D252 Subserosal leiomyoma of uterus: Secondary | ICD-10-CM

## 2023-11-15 DIAGNOSIS — D219 Benign neoplasm of connective and other soft tissue, unspecified: Secondary | ICD-10-CM | POA: Insufficient documentation

## 2023-11-15 DIAGNOSIS — R599 Enlarged lymph nodes, unspecified: Secondary | ICD-10-CM

## 2023-11-15 DIAGNOSIS — N852 Hypertrophy of uterus: Secondary | ICD-10-CM | POA: Diagnosis not present

## 2023-11-15 DIAGNOSIS — N946 Dysmenorrhea, unspecified: Secondary | ICD-10-CM | POA: Diagnosis not present

## 2023-11-15 NOTE — Progress Notes (Signed)
   GYN VISIT Patient name: Frances Pierce MRN [id number]  Date of birth: 1996-05-23 Chief Complaint:   Follow-up ([place] today)  History of Present Illness:   Frances Pierce is a 27 y.o. G0P0 female being seen today for the following concerns:     Enlarged uterus: Records reviewed patient reports monthly menses x 5 days typically changing a pad or tampon every 4 hours with passage of small clots.  [place] today: enlarged heterogeneous anteverted uterus,posterior right subserosal fibroid 12.5 x  12.3 x 10.3 cm,EEC 6.5 mm,normal ovaries,ovaries appear mobile,no free fluid,no pain during ultrasound   Contraception: abstinence Patient's last menstrual period was 09/30/2023.    Review of Systems:   Pertinent items are noted in HPI Denies fever/chills, dizziness, headaches, visual disturbances, fatigue, shortness of breath, chest pain, abdominal pain, vomiting, no problems with periods, bowel movements, urination, or intercourse unless otherwise stated above.  Pertinent History Reviewed:  History reviewed. No pertinent surgical history.  Past Medical History:  Diagnosis Date   Missed period 05/17/2014   Reviewed problem list, medications and allergies. Physical Assessment:   Vitals:   11/15/23 0947  BP: 116/76  Pulse: 78  Weight: 156 lb 12.8 oz (71.1 kg)  Height: 5' 2.5" (1.588 m)  Body mass index is 28.22 kg/m.       Physical Examination:   General appearance: alert, well appearing, and in no distress  Psych: mood appropriate, normal affect  Skin: warm & dry   Cardiovascular: normal heart rate noted  Respiratory: normal respiratory effort, no distress  Abdomen: soft, non-tender, enlarged uterus ~ 1cm below umbilicus  Pelvic: examination not indicated  Extremities: no edema   Chaperone: N/A    Assessment & Plan:  1) Uterine Fibroid -Reviewed results of ultrasound -While patient does note some abdominal distention, she denies any issues with fertility or her menses -Discussed  potential options including conservative monitoring, myomectomy or pending long-term goals uterine artery embolization or even hysterectomy -As mentioned at this moment patient is mostly asymptomatic no interventions indicated at this time   No orders of the defined types were placed in this encounter.   Return in about 1 year (around 11/14/2024) for Annual.   Myna Hidalgo, DO Attending Obstetrician & Gynecologist, [facility] for [facility], [facility]

## 2023-11-15 NOTE — Progress Notes (Signed)
PELVIC US TA/TV: enlarged heterogeneous anteverted uterus,posterior right subserosal fibroid 12.5 x  12.3 x 10.3 cm,EEC 6.5 mm,normal ovaries,ovaries appear mobile,no free fluid,no pain during ultrasound  Chaperone Tammy

## 2024-07-12 ENCOUNTER — Ambulatory Visit: Payer: Self-pay

## 2024-07-14 ENCOUNTER — Ambulatory Visit: Payer: Self-pay

## 2024-07-18 ENCOUNTER — Ambulatory Visit: Payer: Self-pay
